# Patient Record
Sex: Female | Born: 1965 | Race: White | Hispanic: No | Marital: Married | Smoking: Never smoker
Health system: Southern US, Community
[De-identification: ages and names within clinical notes are randomized; demographics above are authoritative.]

## PROBLEM LIST (undated history)

## (undated) HISTORY — PX: HYSTEROSCOPY: SHX211

---

## 2017-12-11 ENCOUNTER — Encounter: Payer: Self-pay | Admitting: Obstetrics and Gynecology

## 2017-12-11 ENCOUNTER — Ambulatory Visit (INDEPENDENT_AMBULATORY_CARE_PROVIDER_SITE_OTHER): Payer: Commercial Managed Care - PPO | Admitting: Obstetrics and Gynecology

## 2017-12-11 VITALS — BP 137/78 | HR 88 | Ht 62.0 in | Wt 165.9 lb

## 2017-12-11 DIAGNOSIS — Z1321 Encounter for screening for nutritional disorder: Secondary | ICD-10-CM | POA: Diagnosis not present

## 2017-12-11 DIAGNOSIS — J0110 Acute frontal sinusitis, unspecified: Secondary | ICD-10-CM

## 2017-12-11 DIAGNOSIS — Z1231 Encounter for screening mammogram for malignant neoplasm of breast: Secondary | ICD-10-CM

## 2017-12-11 DIAGNOSIS — E669 Obesity, unspecified: Secondary | ICD-10-CM

## 2017-12-11 DIAGNOSIS — Z1239 Encounter for other screening for malignant neoplasm of breast: Secondary | ICD-10-CM

## 2017-12-11 DIAGNOSIS — E8940 Asymptomatic postprocedural ovarian failure: Secondary | ICD-10-CM | POA: Diagnosis not present

## 2017-12-11 DIAGNOSIS — Z7689 Persons encountering health services in other specified circumstances: Secondary | ICD-10-CM

## 2017-12-11 DIAGNOSIS — Z79890 Hormone replacement therapy (postmenopausal): Secondary | ICD-10-CM

## 2017-12-11 DIAGNOSIS — Z13220 Encounter for screening for lipoid disorders: Secondary | ICD-10-CM

## 2017-12-11 DIAGNOSIS — Z01419 Encounter for gynecological examination (general) (routine) without abnormal findings: Secondary | ICD-10-CM | POA: Diagnosis not present

## 2017-12-11 MED ORDER — ESTROGENS CONJUGATED 1.25 MG PO TABS: 1.2500 mg | ORAL_TABLET | Freq: Every day | ORAL | 3 refills | Status: DC

## 2017-12-11 MED ORDER — AZITHROMYCIN 250 MG PO TABS: ORAL_TABLET | ORAL | 1 refills | Status: DC

## 2017-12-11 MED ORDER — ESTROGENS CONJUGATED 1.25 MG PO TABS: 1.2500 mg | ORAL_TABLET | Freq: Every day | ORAL | 11 refills | Status: DC

## 2017-12-11 NOTE — Progress Notes (Signed)
Pt is not feeling well she think she may have a sinus infection. Pt is doing well with GYN.

## 2017-12-11 NOTE — Patient Instructions (Signed)
Health Maintenance, Female Adopting a healthy lifestyle and getting preventive care can go a long way to promote health and wellness. Talk with your health care provider about what schedule of regular examinations is right for you. This is a good chance for you to check in with your provider about disease prevention and staying healthy. In between checkups, there are plenty of things you can do on your own. Experts have done a lot of research about which lifestyle changes and preventive measures are most likely to keep you healthy. Ask your health care provider for more information. Weight and diet Eat a healthy diet  Be sure to include plenty of vegetables, fruits, low-fat dairy products, and lean protein.  Do not eat a lot of foods high in solid fats, added sugars, or salt.  Get regular exercise. This is one of the most important things you can do for your health. ? Most adults should exercise for at least 150 minutes each week. The exercise should increase your heart rate and make you sweat (moderate-intensity exercise). ? Most adults should also do strengthening exercises at least twice a week. This is in addition to the moderate-intensity exercise.  Maintain a healthy weight  Body mass index (BMI) is a measurement that can be used to identify possible weight problems. It estimates body fat based on height and weight. Your health care provider can help determine your BMI and help you achieve or maintain a healthy weight.  For females 20 years of age and older: ? A BMI below 18.5 is considered underweight. ? A BMI of 18.5 to 24.9 is normal. ? A BMI of 25 to 29.9 is considered overweight. ? A BMI of 30 and above is considered obese.  Watch levels of cholesterol and blood lipids  You should start having your blood tested for lipids and cholesterol at 50 years of age, then have this test every 5 years.  You may need to have your cholesterol levels checked more often if: ? Your lipid or  cholesterol levels are high. ? You are older than 50 years of age. ? You are at high risk for heart disease.  Cancer screening Lung Cancer  Lung cancer screening is recommended for adults 55-80 years old who are at high risk for lung cancer because of a history of smoking.  A yearly low-dose CT scan of the lungs is recommended for people who: ? Currently smoke. ? Have quit within the past 15 years. ? Have at least a 30-pack-year history of smoking. A pack year is smoking an average of one pack of cigarettes a day for 1 year.  Yearly screening should continue until it has been 15 years since you quit.  Yearly screening should stop if you develop a health problem that would prevent you from having lung cancer treatment.  Breast Cancer  Practice breast self-awareness. This means understanding how your breasts normally appear and feel.  It also means doing regular breast self-exams. Let your health care provider know about any changes, no matter how small.  If you are in your 20s or 30s, you should have a clinical breast exam (CBE) by a health care provider every 1-3 years as part of a regular health exam.  If you are 40 or older, have a CBE every year. Also consider having a breast X-ray (mammogram) every year.  If you have a family history of breast cancer, talk to your health care provider about genetic screening.  If you are at high risk   for breast cancer, talk to your health care provider about having an MRI and a mammogram every year.  Breast cancer gene (BRCA) assessment is recommended for women who have family members with BRCA-related cancers. BRCA-related cancers include: ? Breast. ? Ovarian. ? Tubal. ? Peritoneal cancers.  Results of the assessment will determine the need for genetic counseling and BRCA1 and BRCA2 testing.  Cervical Cancer Your health care provider may recommend that you be screened regularly for cancer of the pelvic organs (ovaries, uterus, and  vagina). This screening involves a pelvic examination, including checking for microscopic changes to the surface of your cervix (Pap test). You may be encouraged to have this screening done every 3 years, beginning at age 22.  For women ages 56-65, health care providers may recommend pelvic exams and Pap testing every 3 years, or they may recommend the Pap and pelvic exam, combined with testing for human papilloma virus (HPV), every 5 years. Some types of HPV increase your risk of cervical cancer. Testing for HPV may also be done on women of any age with unclear Pap test results.  Other health care providers may not recommend any screening for nonpregnant women who are considered low risk for pelvic cancer and who do not have symptoms. Ask your health care provider if a screening pelvic exam is right for you.  If you have had past treatment for cervical cancer or a condition that could lead to cancer, you need Pap tests and screening for cancer for at least 20 years after your treatment. If Pap tests have been discontinued, your risk factors (such as having a new sexual partner) need to be reassessed to determine if screening should resume. Some women have medical problems that increase the chance of getting cervical cancer. In these cases, your health care provider may recommend more frequent screening and Pap tests.  Colorectal Cancer  This type of cancer can be detected and often prevented.  Routine colorectal cancer screening usually begins at 50 years of age and continues through 50 years of age.  Your health care provider may recommend screening at an earlier age if you have risk factors for colon cancer.  Your health care provider may also recommend using home test kits to check for hidden blood in the stool.  A small camera at the end of a tube can be used to examine your colon directly (sigmoidoscopy or colonoscopy). This is done to check for the earliest forms of colorectal  cancer.  Routine screening usually begins at age 33.  Direct examination of the colon should be repeated every 5-10 years through 50 years of age. However, you may need to be screened more often if early forms of precancerous polyps or small growths are found.  Skin Cancer  Check your skin from head to toe regularly.  Tell your health care provider about any new moles or changes in moles, especially if there is a change in a mole's shape or color.  Also tell your health care provider if you have a mole that is larger than the size of a pencil eraser.  Always use sunscreen. Apply sunscreen liberally and repeatedly throughout the day.  Protect yourself by wearing long sleeves, pants, a wide-brimmed hat, and sunglasses whenever you are outside.  Heart disease, diabetes, and high blood pressure  High blood pressure causes heart disease and increases the risk of stroke. High blood pressure is more likely to develop in: ? People who have blood pressure in the high end of  the normal range (130-139/85-89 mm Hg). ? People who are overweight or obese. ? People who are African American.  If you are 21-29 years of age, have your blood pressure checked every 3-5 years. If you are 3 years of age or older, have your blood pressure checked every year. You should have your blood pressure measured twice-once when you are at a hospital or clinic, and once when you are not at a hospital or clinic. Record the average of the two measurements. To check your blood pressure when you are not at a hospital or clinic, you can use: ? An automated blood pressure machine at a pharmacy. ? A home blood pressure monitor.  If you are between 17 years and 37 years old, ask your health care provider if you should take aspirin to prevent strokes.  Have regular diabetes screenings. This involves taking a blood sample to check your fasting blood sugar level. ? If you are at a normal weight and have a low risk for diabetes,  have this test once every three years after 50 years of age. ? If you are overweight and have a high risk for diabetes, consider being tested at a younger age or more often. Preventing infection Hepatitis B  If you have a higher risk for hepatitis B, you should be screened for this virus. You are considered at high risk for hepatitis B if: ? You were born in a country where hepatitis B is common. Ask your health care provider which countries are considered high risk. ? Your parents were born in a high-risk country, and you have not been immunized against hepatitis B (hepatitis B vaccine). ? You have HIV or AIDS. ? You use needles to inject street drugs. ? You live with someone who has hepatitis B. ? You have had sex with someone who has hepatitis B. ? You get hemodialysis treatment. ? You take certain medicines for conditions, including cancer, organ transplantation, and autoimmune conditions.  Hepatitis C  Blood testing is recommended for: ? Everyone born from [birth year] through 1963. ? Anyone with known risk factors for hepatitis C.  Sexually transmitted infections (STIs)  You should be screened for sexually transmitted infections (STIs) including gonorrhea and chlamydia if: ? You are sexually active and are younger than 50 years of age. ? You are older than 50 years of age and your health care provider tells you that you are at risk for this type of infection. ? Your sexual activity has changed since you were last screened and you are at an increased risk for chlamydia or gonorrhea. Ask your health care provider if you are at risk.  If you do not have HIV, but are at risk, it may be recommended that you take a prescription medicine daily to prevent HIV infection. This is called pre-exposure prophylaxis (PrEP). You are considered at risk if: ? You are sexually active and do not regularly use condoms or know the HIV status of your partner(s). ? You take drugs by injection. ? You are  sexually active with a partner who has HIV.  Talk with your health care provider about whether you are at high risk of being infected with HIV. If you choose to begin PrEP, you should first be tested for HIV. You should then be tested every 3 months for as long as you are taking PrEP. Pregnancy  If you are premenopausal and you may become pregnant, ask your health care provider about preconception counseling.  If you may become  pregnant, take 400 to 800 micrograms (mcg) of folic acid every day.  If you want to prevent pregnancy, talk to your health care provider about birth control (contraception). Osteoporosis and menopause  Osteoporosis is a disease in which the bones lose minerals and strength with aging. This can result in serious bone fractures. Your risk for osteoporosis can be identified using a bone density scan.  If you are 89 years of age or older, or if you are at risk for osteoporosis and fractures, ask your health care provider if you should be screened.  Ask your health care provider whether you should take a calcium or vitamin D supplement to lower your risk for osteoporosis.  Menopause may have certain physical symptoms and risks.  Hormone replacement therapy may reduce some of these symptoms and risks. Talk to your health care provider about whether hormone replacement therapy is right for you. Follow these instructions at home:  Schedule regular health, dental, and eye exams.  Stay current with your immunizations.  Do not use any tobacco products including cigarettes, chewing tobacco, or electronic cigarettes.  If you are pregnant, do not drink alcohol.  If you are breastfeeding, limit how much and how often you drink alcohol.  Limit alcohol intake to no more than 1 drink per day for nonpregnant women. One drink equals 12 ounces of beer, 5 ounces of wine, or 1 ounces of hard liquor.  Do not use street drugs.  Do not share needles.  Ask your health care  provider for help if you need support or information about quitting drugs.  Tell your health care provider if you often feel depressed.  Tell your health care provider if you have ever been abused or do not feel safe at home. This information is not intended to replace advice given to you by your health care provider. Make sure you discuss any questions you have with your health care provider. Document Released: 04/30/2011 Document Revised: 03/22/2016 Document Reviewed: 07/19/2015 Elsevier Interactive Patient Education  2018 [place].  Menopause and Hormone Replacement Therapy What is hormone replacement therapy? Hormone replacement therapy (HRT) is the use of artificial (synthetic) hormones to replace hormones that your body stops producing during menopause. Menopause is the normal time of life when menstrual periods stop completely and the ovaries stop producing the female hormones estrogen and progesterone. This lack of hormones can affect your health and cause undesirable symptoms. HRT can relieve some of those symptoms. What are my options for HRT? HRT may consist of the synthetic hormones estrogen and progestin, or it may consist of only estrogen (estrogen-only therapy). You and your health care provider will decide which form of HRT is best for you. If you choose to be on HRT and you have a uterus, estrogen and progestin are usually prescribed. Estrogen-only therapy is used for women who do not have a uterus. Possible options for taking HRT include:  Pills.  Patches.  Gels.  Sprays.  Vaginal cream.  Vaginal rings.  Vaginal inserts.  The amount of hormone(s) that you take and how long you take the hormone(s) varies depending on your individual health. It is important to:  Begin HRT with the lowest possible dosage.  Stop HRT as soon as your health care provider tells you to stop.  Work with your health care provider so that you feel informed and comfortable with your  decisions.  What are the benefits of HRT? HRT can reduce the frequency and severity of menopausal symptoms. Benefits of HRT  vary depending on the menopausal symptoms that you have, the severity of your symptoms, and your overall health. HRT may help to improve the following menopausal symptoms:  Hot flashes and night sweats. These are sudden feelings of heat that spread over the face and body. The skin may turn red, like a blush. Night sweats are hot flashes that happen while you are sleeping or trying to sleep.  Bone loss (osteoporosis). The body loses calcium more quickly after menopause, causing the bones to become weaker. This can increase the risk for bone breaks (fractures).  Vaginal dryness. The lining of the vagina can become thin and dry, which can cause pain during sexual intercourse or cause infection, burning, or itching.  Urinary tract infections.  Urinary incontinence. This is a decreased ability to control when you urinate.  Irritability.  Short-term memory problems.  What are the risks of HRT? Risks of HRT vary depending on your individual health and medical history. Risks of HRT also depend on whether you receive both estrogen and progestin or you receive estrogen only.HRT may increase the risk of:  Spotting. This is when a small amount of bloodleaks from the vagina unexpectedly.  Endometrial cancer. This cancer is in the lining of the uterus (endometrium).  Breast cancer.  Increased density of breast tissue. This can make it harder to find breast cancer on a breast X-ray (mammogram).  Stroke.  Heart attack.  Blood clots.  Gallbladder disease.  Risks of HRT can increase if you have any of the following conditions:  Endometrial cancer.  Liver disease.  Heart disease.  Breast cancer.  History of blood clots.  History of stroke.  How should I care for myself while I am on HRT?  Take over-the-counter and prescription medicines only as told by your  health care provider.  Get mammograms, pelvic exams, and medical checkups as often as told by your health care provider.  Have Pap tests done as often as told by your health care provider. A Pap test is sometimes called a Pap smear. It is a screening test that is used to check for signs of cancer of the cervix and vagina. A Pap test can also identify the presence of infection or precancerous changes. Pap tests may be done: ? Every 3 years, starting at age 20. ? Every 5 years, starting after age 72, in combination with testing for human papillomavirus (HPV). ? More often or less often depending on other medical conditions you have, your age, and other risk factors.  It is your responsibility to get your Pap test results. Ask your health care provider or the department performing the test when your results will be ready.  Keep all follow-up visits as told by your health care provider. This is important. When should I seek medical care? Talk with your health care provider if:  You have any of these: ? Pain or swelling in your legs. ? Shortness of breath. ? Chest pain. ? Lumps or changes in your breasts or armpits. ? Slurred speech. ? Pain, burning, or bleeding when you urine.  You develop any of these: ? Unusual vaginal bleeding. ? Dizziness or headaches. ? Weakness or numbness in any part of your arms or legs. ? Pain in your abdomen.  This information is not intended to replace advice given to you by your health care provider. Make sure you discuss any questions you have with your health care provider. Document Released: 07/14/2003 Document Revised: 09/11/2016 Document Reviewed: 04/18/2015 Elsevier Interactive Patient Education  2017 [place].

## 2017-12-12 LAB — TSH: TSH: 1.65 u[IU]/mL (ref 0.450–4.500)

## 2017-12-12 LAB — COMPREHENSIVE METABOLIC PANEL
ALBUMIN: 4.4 g/dL (ref 3.5–5.5)
ALK PHOS: 66 IU/L (ref 39–117)
ALT: 20 IU/L (ref 0–32)
AST: 19 IU/L (ref 0–40)
Albumin/Globulin Ratio: 1.4 (ref 1.2–2.2)
BUN / CREAT RATIO: 24 — AB (ref 9–23)
BUN: 19 mg/dL (ref 6–24)
CHLORIDE: 100 mmol/L (ref 96–106)
CO2: 23 mmol/L (ref 20–29)
CREATININE: 0.78 mg/dL (ref 0.57–1.00)
Calcium: 9.5 mg/dL (ref 8.7–10.2)
GFR calc Af Amer: 103 mL/min/{1.73_m2} (ref >59)
GFR calc non Af Amer: 90 mL/min/{1.73_m2} (ref >59)
GLOBULIN, TOTAL: 3.2 g/dL (ref 1.5–4.5)
Glucose: 90 mg/dL (ref 65–99)
Potassium: 5 mmol/L (ref 3.5–5.2)
SODIUM: 137 mmol/L (ref 134–144)
Total Protein: 7.6 g/dL (ref 6.0–8.5)

## 2017-12-12 LAB — CBC
HEMATOCRIT: 41.1 % (ref 34.0–46.6)
HEMOGLOBIN: 13.5 g/dL (ref 11.1–15.9)
MCH: 30.4 pg (ref 26.6–33.0)
MCHC: 32.8 g/dL (ref 31.5–35.7)
MCV: 93 fL (ref 79–97)
Platelets: 293 10*3/uL (ref 150–379)
RBC: 4.44 x10E6/uL (ref 3.77–5.28)
RDW: 12.6 % (ref 12.3–15.4)
WBC: 5.7 10*3/uL (ref 3.4–10.8)

## 2017-12-12 LAB — VITAMIN D 25 HYDROXY (VIT D DEFICIENCY, FRACTURES): Vit D, 25-Hydroxy: 12.5 ng/mL — ABNORMAL LOW (ref 30.0–100.0)

## 2017-12-12 LAB — LIPID PANEL
CHOL/HDL RATIO: 3.3 ratio (ref 0.0–4.4)
Cholesterol, Total: 271 mg/dL — ABNORMAL HIGH (ref 100–199)
HDL: 83 mg/dL (ref >39)
LDL Calculated: 157 mg/dL — ABNORMAL HIGH (ref 0–99)
Triglycerides: 157 mg/dL — ABNORMAL HIGH (ref 0–149)
VLDL CHOLESTEROL CAL: 31 mg/dL (ref 5–40)

## 2017-12-13 ENCOUNTER — Other Ambulatory Visit: Payer: Self-pay

## 2017-12-13 NOTE — Progress Notes (Signed)
GYNECOLOGY ANNUAL PHYSICAL EXAM PROGRESS NOTE  Subjective:    Brenda Dean is a 52 y.o. G61P1001 female who presents to establish care and for an annual exam. She has relocated from the [facility] area of [place]. The patient has no complaints today. The patient is sexually active.  The patient is on hormonal replacement therapy, and has been on it for ~ 16 years since her total hysterectomy (performed for severe endometriosis). The patient wears seatbelts: yes. The patient participates in regular exercise: no. Has the patient ever been transfused or tattooed?: no. The patient reports that there is not] domestic violence in her life.    Gynecologic History Menarche age: 21 No LMP recorded. Patient has had a hysterectomy. Contraception: status post hysterectomy History of STI's: Denies Last Pap: ~ 16 years ago. Results were: normal.  Denies h/o abnormal pap smears. Last mammogram: 3-4 years ago. Results were: normal   Obstetric History   G1   P1   T1   P0   A0   L0    SAB0   TAB0   Ectopic0   Multiple0   Live Births0     # Outcome Date GA Lbr Len/2nd Weight Sex Delivery Anes PTL Lv  1 Term 56    M Vag-Spont         History reviewed. No pertinent past medical history.    Past Surgical History:  Procedure Laterality Date  . HYSTEROSCOPY      Family History  Problem Relation Age of Onset  . Hypertension Mother   . Diabetes Father   . Hypertension Father     Social History   Socioeconomic History  . Marital status: Married    Spouse name: Not on file  . Number of children: Not on file  . Years of education: Not on file  . Highest education level: Not on file  Social Needs  . Financial resource strain: Not on file  . Food insecurity - worry: Not on file  . Food insecurity - inability: Not on file  . Transportation needs - medical: Not on file  . Transportation needs - non-medical: Not on file  Occupational History  . Not on file  Tobacco Use  . Smoking status:  Never Smoker  . Smokeless tobacco: Never Used  Substance and Sexual Activity  . Alcohol use: No    Frequency: Never  . Drug use: No  . Sexual activity: Yes    Birth control/protection: Surgical  Other Topics Concern  . Not on file  Social History Narrative  . Not on file    Outpatient Encounter Medications as of 12/11/2017  Medication Sig  . estrogens, conjugated, (PREMARIN) 1.25 MG tablet Take 1 tablet (1.25 mg total) by mouth daily.  . fluticasone (FLONASE) 50 MCG/ACT nasal spray Place 1 spray into both nostrils daily.   No facility-administered encounter medications on file as of 12/11/2017.      Allergies  Allergen Reactions  . Penicillins Rash     Review of Systems Constitutional: negative for chills, fatigue, fevers and sweats Eyes: negative for irritation, redness and visual disturbance Ears, nose, mouth, throat, and face: negative for hearing loss, nasal congestion and pressure (x 2 weeks, not responding to OTC meds), snoring and tinnitus Respiratory: negative for asthma, cough, sputum Cardiovascular: negative for chest pain, dyspnea, exertional chest pressure/discomfort, irregular heart beat, palpitations and syncope Gastrointestinal: negative for abdominal pain, change in bowel habits, nausea and vomiting Genitourinary: negative for abnormal menstrual periods, genital lesions,  sexual problems and vaginal discharge, dysuria and urinary incontinence Integument/breast: negative for breast lump, breast tenderness and nipple discharge Hematologic/lymphatic: negative for bleeding and easy bruising Musculoskeletal:negative for back pain and muscle weakness Neurological: negative for dizziness, headaches, vertigo and weakness Endocrine: negative for diabetic symptoms including polydipsia, polyuria and skin dryness Allergic/Immunologic: negative for hay fever and urticaria       Objective:  Blood pressure 137/78, pulse 88, height 5\' 2"  (1.575 m), weight 165 lb 14.4 oz  (75.3 kg). Body mass index is 30.34 kg/m.  General Appearance:    Alert, cooperative, no distress, appears stated age, mild obesity  Head:    Normocephalic, without obvious abnormality, atraumatic  Eyes:    PERRL, conjunctiva/corneas clear, EOM's intact, both eyes  Ears:    Normal external ear canals, both ears  Nose:   Nares normal, septum midline, mucosa normal, no drainage. + mild sinus tenderness  Throat:   Lips, mucosa, and tongue normal; teeth and gums normal  Neck:   Supple, symmetrical, trachea midline, no adenopathy; thyroid: no enlargement/tenderness/nodules; no carotid bruit or JVD  Back:     Symmetric, no curvature, ROM normal, no CVA tenderness  Lungs:     Clear to auscultation bilaterally, respirations unlabored  Chest Wall:    No tenderness or deformity   Heart:    Regular rate and rhythm, S1 and S2 normal, no murmur, rub or gallop  Breast Exam:    No tenderness, masses, or nipple abnormality  Abdomen:     Soft, non-tender, bowel sounds active all four quadrants, no masses, no organomegaly.    Genitalia:    Pelvic:external genitalia normal, vagina without lesions, discharge, or tenderness, rectovaginal septum  normal. Cervix normal in appearance, no cervical motion tenderness, no adnexal masses or tenderness.  Uterus normal size, shape, mobile, regular contours, nontender.  Rectal:    Normal external sphincter.  No hemorrhoids appreciated. Internal exam not done.   Extremities:   Extremities normal, atraumatic, no cyanosis or edema  Pulses:   2+ and symmetric all extremities  Skin:   Skin color, texture, turgor normal, no rashes or lesions  Lymph nodes:   Cervical, supraclavicular, and axillary nodes normal  Neurologic:   CNII-XII intact, normal strength, sensation and reflexes throughout   .  Labs:   No labs   Assessment:    Healthy female exam.    Establish care   Menopausal HRT Sinus infection  Plan:     Blood tests: CBC with diff, Comprehensive metabolic  panel, Lipoproteins, TSH and Vitamin D. Breast self exam technique reviewed and patient encouraged to perform self-exam monthly. Contraception: post hysterectomy status. Discussed healthy lifestyle modifications. Mammogram. Pap smear.   Will be due for colon cancer screening within 1 year.  Menopause on HRT - patient has been on HRT for some time.  Desires refill of her hormones, specifically her estrogen. Discussed weaning and importance of using smallest dose for shortest time frame.  Patient notes she has tried weaning in the past which was unsuccessful. Will give refill, advised patient to try weaning again.   Discussed other conservative management options including saline spray, Sudafed, Mucinex, etc. If this does not work, will prescribe a Z-pack.   Follow up in 1 year for annual exam.    Hildred Laserherry, Irie Dowson, MD [facility]

## 2017-12-16 ENCOUNTER — Other Ambulatory Visit: Payer: Self-pay

## 2017-12-16 MED ORDER — VITAMIN D (ERGOCALCIFEROL) 1.25 MG (50000 UNIT) PO CAPS: 50000.0000 [IU] | ORAL_CAPSULE | ORAL | 0 refills | Status: DC

## 2017-12-16 NOTE — Telephone Encounter (Signed)
-----   Message from Hildred LaserAnika Cherry, MD sent at 12/13/2017  9:06 AM EST ----- Please inform patient that her cholesterol panel was mildly elevated. I would encourage her to endorse a low-fat, low-cholesterol diet, and encourage exercise 3-5 days a week for at least 30 minutes. We will check this again next year.  Her vitamin D levels were also very low. She will need to begin taking a supplement, Vitamin D 50,000 IU weekly x 12 weeks (needs to be prescribed), followed by a repeat lab in 3 months.

## 2017-12-16 NOTE — Telephone Encounter (Signed)
Pt called and informed that her Vit d 50000 was sent to the pharmacy.

## 2018-01-08 ENCOUNTER — Ambulatory Visit (INDEPENDENT_AMBULATORY_CARE_PROVIDER_SITE_OTHER): Payer: Commercial Managed Care - PPO | Admitting: Obstetrics and Gynecology

## 2018-01-08 ENCOUNTER — Encounter: Payer: Self-pay | Admitting: Obstetrics and Gynecology

## 2018-01-08 VITALS — BP 120/71 | HR 78 | Ht 62.0 in | Wt 173.8 lb

## 2018-01-08 DIAGNOSIS — E669 Obesity, unspecified: Secondary | ICD-10-CM

## 2018-01-08 MED ORDER — CYANOCOBALAMIN 1000 MCG/ML IJ SOLN: 1000.0000 ug | INTRAMUSCULAR | 1 refills | Status: DC

## 2018-01-08 NOTE — Progress Notes (Signed)
Subjective:  Arline AspCindy Fair is a 52 y.o. G1P1000 at Unknown being seen today for weight loss management- initial visit.  Patient reports General ROS: negative and reports previous weight loss attempts: started Keto diet in October 2018. Also used phentermine in past and did well.  Onset was gradual over year(s) .   Associated symptoms include: fatigue, change in clothing fit.   The patient has a surgical history ZO:[id number]:hysterectomy.   Past treatment has included: small frequent feedings,  appetite stimulant, exercise management.  The following portions of the patient's history were reviewed and updated as appropriate: allergies, current medications, past family history, past medical history, past social history, past surgical history and problem list.   Objective:   Vitals:   01/08/18 0938  BP: 120/71  Pulse: 78  Weight: 173 lb 12.8 oz (78.8 kg)  Height: 5\' 2"  (1.575 m)    General:  Alert, oriented and cooperative. Patient is in no acute distress.  :   :   :   :   :   :   PE: Well groomed female in no current distress,   Mental Status: Normal mood and affect. Normal behavior. Normal judgment and thought content.   Current BMI: Body mass index is 31.79 kg/m.   Assessment and Plan:  Obesity  There are no diagnoses linked to this encounter.  Plan: low carb, High protein diet RX for adipex 37.5 mg daily and B12 1000mcg.ml monthly, to start now with first injection given at today's visit. Reviewed side-effects common to both medications and expected outcomes. Increase daily water intake to at least 8 bottle a day, every day.  Goal is to reduse weight by 10% by end of three months, and will re-evaluate then.  RTC in 4 weeks for Nurse visit to check weight & BP, and get next B12 injections.    Please refer to After Visit Summary for other counseling recommendations.    VinelandShambley, Melody N, CNM   Melody [place] LakengrenN Shambley, CNM      Consider the Low Glycemic Index Diet and 6  smaller meals daily .  This boosts your metabolism and regulates your sugars:   Use the protein bar by Atkins because they have lots of fiber in them  Find the low carb flatbreads, tortillas and pita breads for sandwiches:  Joseph's makes a pita bread and a flat bread , available at [facility] and [employer]; Toufayah makes a low carb flatbread available at [employer] and HT that is 9 net carbs and 100 cal Mission makes a low carb whole wheat tortilla available at [employer],and most grocery stores with 6 net carbs and 210 cal  [place] yogurt can still have a lot of carbs .  Dannon Light N fit has 80 cal and 8 carbs

## 2018-01-08 NOTE — Patient Instructions (Signed)

## 2018-02-03 ENCOUNTER — Ambulatory Visit: Payer: Commercial Managed Care - PPO | Admitting: Obstetrics and Gynecology

## 2018-02-17 ENCOUNTER — Ambulatory Visit: Payer: Commercial Managed Care - PPO | Admitting: Obstetrics and Gynecology

## 2018-02-21 ENCOUNTER — Ambulatory Visit (INDEPENDENT_AMBULATORY_CARE_PROVIDER_SITE_OTHER): Payer: Commercial Managed Care - PPO | Admitting: Obstetrics and Gynecology

## 2018-02-21 ENCOUNTER — Encounter: Payer: Self-pay | Admitting: Obstetrics and Gynecology

## 2018-02-21 VITALS — BP 143/83 | HR 89 | Wt 163.7 lb

## 2018-02-21 DIAGNOSIS — E669 Obesity, unspecified: Secondary | ICD-10-CM | POA: Diagnosis not present

## 2018-02-21 MED ORDER — CYANOCOBALAMIN 1000 MCG/ML IJ SOLN
1000.0000 ug | Freq: Once | INTRAMUSCULAR | Status: AC
  Administered 2018-02-21: 1000 ug via INTRAMUSCULAR

## 2018-02-21 NOTE — Progress Notes (Signed)
Pt is here for wt, bp check, b-12 inj She is doing well, pt has lost a total of 10lbs, she is very happy  02/21/18 wt- 163.7lb 01/08/18 wt-173.5lb

## 2018-03-04 ENCOUNTER — Other Ambulatory Visit: Payer: Self-pay | Admitting: Obstetrics and Gynecology

## 2018-03-11 ENCOUNTER — Other Ambulatory Visit: Payer: Self-pay | Admitting: Obstetrics and Gynecology

## 2018-03-21 ENCOUNTER — Encounter: Payer: Commercial Managed Care - PPO | Admitting: Obstetrics and Gynecology

## 2018-04-01 ENCOUNTER — Other Ambulatory Visit: Payer: Self-pay | Admitting: Obstetrics and Gynecology

## 2018-06-20 ENCOUNTER — Encounter: Payer: Commercial Managed Care - PPO | Admitting: Obstetrics and Gynecology

## 2018-06-24 ENCOUNTER — Ambulatory Visit: Payer: 59 | Admitting: Obstetrics and Gynecology

## 2018-06-24 ENCOUNTER — Encounter: Payer: Self-pay | Admitting: Obstetrics and Gynecology

## 2018-06-24 VITALS — BP 141/84 | HR 85 | Ht 62.0 in | Wt 172.9 lb

## 2018-06-24 DIAGNOSIS — K5909 Other constipation: Secondary | ICD-10-CM | POA: Diagnosis not present

## 2018-06-24 DIAGNOSIS — Z13220 Encounter for screening for lipoid disorders: Secondary | ICD-10-CM

## 2018-06-24 DIAGNOSIS — E669 Obesity, unspecified: Secondary | ICD-10-CM

## 2018-06-24 DIAGNOSIS — E559 Vitamin D deficiency, unspecified: Secondary | ICD-10-CM | POA: Diagnosis not present

## 2018-06-24 DIAGNOSIS — Z6831 Body mass index (BMI) 31.0-31.9, adult: Secondary | ICD-10-CM | POA: Diagnosis not present

## 2018-06-24 MED ORDER — CYANOCOBALAMIN 1000 MCG/ML IJ SOLN
1000.0000 ug | INTRAMUSCULAR | 1 refills | Status: DC
Start: 2018-06-24 — End: 2018-10-13

## 2018-06-24 MED ORDER — POLYETHYLENE GLYCOL 3350 17 GM/SCOOP PO POWD: 1.0000 | Freq: Once | ORAL | 6 refills | Status: AC

## 2018-06-24 MED ORDER — PHENTERMINE HCL 37.5 MG PO TABS: 37.5000 mg | ORAL_TABLET | Freq: Every day | ORAL | 2 refills | Status: DC

## 2018-06-24 NOTE — Progress Notes (Signed)
Subjective:  Brenda Dean is a 52 y.o. G1P1000 at Unknown being seen today for weight loss management- initial visit.  Patient reports General ROS: negative and reports previous weight loss attempts: Management changes made at the last visit include: finished medications and just went back to old eating habits this summer. Desires restart of weight loss program. Also needs labs repeated for Vit D and lipid panels.   Reports longstanding history of constipation with only one BM a week and it is very large. It got worse with phentermine use.      Associated symptoms include: fatigue,abdominal pain/bloating, headaches, & change in clothing fit Previous/Current treatment includes: small frequent feedings, vitamin B-12 injections and appetite stimulant.  Pertinent medical history includes:  Vitamin d deficiency, constipation, elevated cholesterol.  Plans to use treadmill daily and adding weights. Will increase water intake again.   The following portions of the patient's history were reviewed and updated as appropriate: allergies, current medications, past family history, past medical history, past social history, past surgical history and problem list.   Objective:   Vitals:   06/24/18 1028  BP: (!) 141/84  Pulse: 85  Weight: 172 lb 14.4 oz (78.4 kg)  Height: 5\' 2"  (1.575 m)    General:  Alert, oriented and cooperative. Patient is in no acute distress.  :   :   :   :   :   :   PE: Well groomed female in no current distress,   Mental Status: Normal mood and affect. Normal behavior. Normal judgment and thought content.   Current BMI: Body mass index is 31.62 kg/m.   Assessment and Plan:  Obesity  1. Obesity (BMI 30-39.9)  - phentermine (ADIPEX-P) 37.5 MG tablet; Take 1 tablet (37.5 mg total) by mouth daily before breakfast.  Dispense: 30 tablet; Refill: 2   Plan: low carb, High protein diet RX for adipex 37.5 mg daily and B12 1000mcg.ml monthly, to start now with first  injection given at today's visit. Reviewed side-effects common to both medications and expected outcomes. Increase daily water intake to at least 8 bottle a day, every day.  Goal is to reduse weight by 10% by end of three months, and will re-evaluate then.  RTC in 4 weeks for Nurse visit to check weight & BP, and get next B12 injections.    Please refer to After Visit Summary for other counseling recommendations.    HancockShambley, Melody N, CNM   Melody [place]crestN Shambley, CNM       steps you can take to lower your prolactin levels include:  changing your diet and keeping your stress levels down stopping high-intensity workouts or activities that overwhelm you avoiding clothing that makes your chest uncomfortable avoiding activities and clothing that overstimulate your nipples taking vitamin B-6 and vitamin E supplements  2. Vit D deficiency Labs rechecked- information to review in AVS  3. chronic constipation To add Miralax as needed.- information on high fiber foods in AVS to review at home.

## 2018-06-24 NOTE — Patient Instructions (Addendum)
steps you can take to lower your prolactin levels include:     Vitamin D Deficiency Vitamin D deficiency is when your body does not have enough vitamin D. Vitamin D is important to your body for many reasons:  It helps the body to absorb two important minerals, called calcium and phosphorus.  It plays a role in bone health.  It may help to prevent some diseases, such as diabetes and multiple sclerosis.  It plays a role in muscle function, including heart function.  You can get vitamin D by:  Eating foods that naturally contain vitamin D.  Eating or drinking milk or other dairy products that have vitamin D added to them.  Taking a vitamin D supplement or a multivitamin supplement that contains vitamin D.  Being in the sun. Your body naturally makes vitamin D when your skin is exposed to sunlight. Your body changes the sunlight into a form of the vitamin that the body can use.  If vitamin D deficiency is severe, it can cause a condition in which your bones become soft. In adults, this condition is called osteomalacia. In children, this condition is called rickets. What are the causes? Vitamin D deficiency may be caused by:  Not eating enough foods that contain vitamin D.  Not getting enough sun exposure.  Having certain digestive system diseases that make it difficult for your body to absorb vitamin D. These diseases include Crohn disease, chronic pancreatitis, and cystic fibrosis.  Having a surgery in which a part of the stomach or a part of the small intestine is removed.  Being obese.  Having chronic kidney disease or liver disease.  What increases the risk? This condition is more likely to develop in:  Older people.  People who do not spend much time outdoors.  People who live in a long-term care facility.  People who have had broken bones.  People with weak or thin bones (osteoporosis).  People who have a disease or condition that changes how the body  absorbs vitamin D.  People who have dark skin.  People who take certain medicines, such as steroid medicines or certain seizure medicines.  People who are overweight or obese.  What are the signs or symptoms? In mild cases of vitamin D deficiency, there may not be any symptoms. If the condition is severe, symptoms may include:  Bone pain.  Muscle pain.  Falling often.  Broken bones caused by a Bebout injury.  How is this diagnosed? This condition is usually diagnosed with a blood test. How is this treated? Treatment for this condition may depend on what caused the condition. Treatment options include:  Taking vitamin D supplements.  Taking a calcium supplement. Your health care provider will suggest what dose is best for you.  Follow these instructions at home:  Take medicines and supplements only as told by your health care provider.  Eat foods that contain vitamin D. Choices include: ? Fortified dairy products, cereals, or juices. Fortified means that vitamin D has been added to the food. Check the label on the package to be sure. ? Fatty fish, such as salmon or trout. ? Eggs. ? Oysters.  Do not use a tanning bed.  Maintain a healthy weight. Lose weight, if needed.  Keep all follow-up visits as told by your health care provider. This is important. Contact a health care provider if:  Your symptoms do not go away.  You feel like throwing up (nausea) or you throw up (vomit).  You have  fewer bowel movements than usual or it is difficult for you to have a bowel movement (constipation). This information is not intended to replace advice given to you by your health care provider. Make sure you discuss any questions you have with your health care provider. Document Released: 01/07/2012 Document Revised: 03/28/2016 Document Reviewed: 03/02/2015 Elsevier Interactive Patient Education  2018 [employer].  Constipation, Adult Constipation is when a person has fewer bowel  movements in a week than normal, has difficulty having a bowel movement, or has stools that are dry, hard, or larger than normal. Constipation may be caused by an underlying condition. It may become worse with age if a person takes certain medicines and does not take in enough fluids. Follow these instructions at home: Eating and drinking   Eat foods that have a lot of fiber, such as fresh fruits and vegetables, whole grains, and beans.  Limit foods that are high in fat, low in fiber, or overly processed, such as french fries, hamburgers, cookies, candies, and soda.  Drink enough fluid to keep your urine clear or pale yellow. General instructions  Exercise regularly or as told by your health care provider.  Go to the restroom when you have the urge to go. Do not hold it in.  Take over-the-counter and prescription medicines only as told by your health care provider. These include any fiber supplements.  Practice pelvic floor retraining exercises, such as deep breathing while relaxing the lower abdomen and pelvic floor relaxation during bowel movements.  Watch your condition for any changes.  Keep all follow-up visits as told by your health care provider. This is important. Contact a health care provider if:  You have pain that gets worse.  You have a fever.  You do not have a bowel movement after 4 days.  You vomit.  You are not hungry.  You lose weight.  You are bleeding from the anus.  You have thin, pencil-like stools. Get help right away if:  You have a fever and your symptoms suddenly get worse.  You leak stool or have blood in your stool.  Your abdomen is bloated.  You have severe pain in your abdomen.  You feel dizzy or you faint. This information is not intended to replace advice given to you by your health care provider. Make sure you discuss any questions you have with your health care provider. Document Released: 07/13/2004 Document Revised: 05/04/2016  Document Reviewed: 04/04/2016 Elsevier Interactive Patient Education  2018 [employer].

## 2018-06-25 LAB — LIPID PANEL
Chol/HDL Ratio: 3.3 ratio (ref 0.0–4.4)
Cholesterol, Total: 210 mg/dL — ABNORMAL HIGH (ref 100–199)
HDL: 64 mg/dL (ref >39)
LDL CALC: 119 mg/dL — AB (ref 0–99)
Triglycerides: 137 mg/dL (ref 0–149)
VLDL CHOLESTEROL CAL: 27 mg/dL (ref 5–40)

## 2018-06-25 LAB — VITAMIN D 25 HYDROXY (VIT D DEFICIENCY, FRACTURES): Vit D, 25-Hydroxy: 27.3 ng/mL — ABNORMAL LOW (ref 30.0–100.0)

## 2018-07-21 ENCOUNTER — Ambulatory Visit: Payer: 59 | Admitting: Obstetrics and Gynecology

## 2018-10-13 ENCOUNTER — Ambulatory Visit: Payer: 59 | Admitting: Obstetrics and Gynecology

## 2018-10-13 ENCOUNTER — Encounter: Payer: Self-pay | Admitting: Obstetrics and Gynecology

## 2018-10-13 DIAGNOSIS — E669 Obesity, unspecified: Secondary | ICD-10-CM

## 2018-10-13 MED ORDER — PHENTERMINE HCL 37.5 MG PO TABS: 37.5000 mg | ORAL_TABLET | Freq: Every day | ORAL | 2 refills | Status: DC

## 2018-10-13 MED ORDER — CYANOCOBALAMIN 1000 MCG/ML IJ SOLN: 1000.0000 ug | INTRAMUSCULAR | 1 refills | Status: DC

## 2018-10-13 NOTE — Progress Notes (Signed)
Here for follow up weight visit and B12. Desires to continue with medication to get BMI <27. Is using treadmill and taking stairs.  Has cut out all sweet drinks.     10/13/18 161 lbs 06/24/18 172 lbs 14 oz   Waist 36 in  B12 injection given and will Return to clinic in 4-8 weeks for weight check.   Melody Shambley,CNM

## 2018-11-12 ENCOUNTER — Ambulatory Visit: Payer: 59 | Admitting: Obstetrics and Gynecology

## 2018-12-19 ENCOUNTER — Other Ambulatory Visit: Payer: Self-pay | Admitting: Obstetrics and Gynecology

## 2019-01-08 ENCOUNTER — Other Ambulatory Visit: Payer: Self-pay | Admitting: Obstetrics and Gynecology

## 2019-04-10 ENCOUNTER — Other Ambulatory Visit: Payer: Self-pay | Admitting: Obstetrics and Gynecology

## 2019-04-29 ENCOUNTER — Other Ambulatory Visit: Payer: Self-pay

## 2019-04-29 ENCOUNTER — Ambulatory Visit (INDEPENDENT_AMBULATORY_CARE_PROVIDER_SITE_OTHER): Payer: 59 | Admitting: Orthopedic Surgery

## 2019-04-29 ENCOUNTER — Ambulatory Visit: Payer: Self-pay

## 2019-04-29 ENCOUNTER — Encounter: Payer: Self-pay | Admitting: Orthopedic Surgery

## 2019-04-29 DIAGNOSIS — M25821 Other specified joint disorders, right elbow: Secondary | ICD-10-CM | POA: Diagnosis not present

## 2019-04-29 DIAGNOSIS — M25521 Pain in right elbow: Secondary | ICD-10-CM

## 2019-04-29 NOTE — Progress Notes (Signed)
Office Visit Note   Patient: Brenda Dean           Date of Birth: [birth year]           MRN: [id number] Visit Date: 04/29/2019 Requested by: No referring provider defined for this encounter. PCP: Patient, No Pcp Per  Subjective: Chief Complaint  Patient presents with  . Right Elbow - Pain    HPI: Brenda Dean is a patient with right elbow pain.  She denies any history of injury.  She is had pain for 6 months.  She is right-hand dominant.  Her pain is worse with any activity.  She also has pain at night on most nights.  The pain will wake her from sleep.  Ibuprofen helps minimally.  Pain radiates up into the shoulder also.  She has some occasional neck pain and occasional numbness and tingling.  The elbow pain is both on the medial and lateral side.  She does have pain with elbow extension.  She is using an elbow sleeve which does not help much.  She feels like she is losing strength in her hand.              ROS: All systems reviewed are negative as they relate to the chief complaint within the history of present illness.  Patient denies  fevers or chills.   Assessment & Plan: Visit Diagnoses:  1. Pain in right elbow   2. Mass of joint of right elbow     Plan: Impression is right elbow pain and mass which appears to be partially cystic by ultrasound examination.  This is a pretty atypical presentation for tennis elbow.  She needs MRI with IV contrast to evaluate cystic or partially cystic structure within the flexor pronator mass..  We do need to get to the bottom of this because it is not a typical presentation of tennis elbow and the night pain is somewhat worrisome.  She is working in an office type job.  Follow-Up Instructions: Return for after MRI.   Orders:  Orders Placed This Encounter  Procedures  . XR Elbow 2 Views Right  . MR Elbow Right w/ contrast  . Arthrogram   No orders of the defined types were placed in this encounter.     Procedures: No procedures performed    Clinical Data: No additional findings.  Objective: Vital Signs: There were no vitals taken for this visit.  Physical Exam:   Constitutional: Patient appears well-developed HEENT:  Head: Normocephalic Eyes:EOM are normal Neck: Normal range of motion Cardiovascular: Normal rate Pulmonary/chest: Effort normal Neurologic: Patient is alert Skin: Skin is warm Psychiatric: Patient has normal mood and affect    Ortho Exam: Ortho exam demonstrates about a 5 degree flexion contracture on the right elbow compared to the left.  She does have full flexion.  She has tenderness in the proximal flexor pronator mass with a fullness in this region.  This is tender to palpation.  Radial pulses intact.  No definite paresthesias on the dorsal or palmar aspect of the hand.  EPL FPL interosseous function is intact.  She has some pain with resisted supination not much pain with resisted pronation.  Only mild tenderness over that lateral epicondyle.  Ultrasound examination demonstrates cystic structure within the flexor pronator mass measuring about 4 x 4 cm.  Not much in the way of tendinosis at the lateral epicondyle.  Specialty Comments:  No specialty comments available.  Imaging: Xr Elbow 2 Views Right  Result Date: 04/29/2019  AP lateral right elbow reviewed.  No arthritis is present.  No soft tissue calcifications.  No fracture.  Normal right elbow    PMFS History: There are no active problems to display for this patient.  History reviewed. No pertinent past medical history.  Family History  Problem Relation Age of Onset  . Hypertension Mother   . Diabetes Father   . Hypertension Father     Past Surgical History:  Procedure Laterality Date  . HYSTEROSCOPY     Social History   Occupational History  . Not on file  Tobacco Use  . Smoking status: Never Smoker  . Smokeless tobacco: Never Used  Substance and Sexual Activity  . Alcohol use: No    Frequency: Never  . Drug use: No  .  Sexual activity: Yes    Birth control/protection: Surgical

## 2019-04-30 NOTE — Progress Notes (Signed)
I've canceled arthrogram and edited note in referral

## 2019-05-06 NOTE — Addendum Note (Signed)
Addended by: Daylene Posey T on: 05/06/2019 11:36 AM   Modules accepted: Orders

## 2019-05-12 ENCOUNTER — Other Ambulatory Visit: Payer: Self-pay | Admitting: Radiology

## 2019-05-12 MED ORDER — DIAZEPAM 5 MG PO TABS: ORAL_TABLET | ORAL | 0 refills | Status: DC

## 2019-06-05 ENCOUNTER — Other Ambulatory Visit: Payer: 59

## 2019-06-15 ENCOUNTER — Telehealth: Payer: Self-pay | Admitting: Orthopedic Surgery

## 2019-06-15 ENCOUNTER — Other Ambulatory Visit: Payer: Self-pay | Admitting: Surgical

## 2019-06-15 MED ORDER — DIAZEPAM 5 MG PO TABS: ORAL_TABLET | ORAL | 0 refills | Status: DC

## 2019-06-15 NOTE — Telephone Encounter (Signed)
Patient called advised she is having an MRI 06/18/2019 and need something to relax her before she have the MRI. The number to contact patient is [phone]

## 2019-06-15 NOTE — Telephone Encounter (Signed)
Can you please submit Valium 5mg  for her prior to MRI procedure? It is considered a controlled substance. Thanks.

## 2019-06-15 NOTE — Telephone Encounter (Signed)
[facility] [place] on [address]. Please see previous note.

## 2019-06-15 NOTE — Telephone Encounter (Signed)
Please send rx mentioned in previous message to this pharmacy.  Thanks.

## 2019-06-16 NOTE — Telephone Encounter (Signed)
I called [facility] [address] and asked that they cancel rx for Valium. [facility] [address] in [place] at [phone]

## 2019-06-16 NOTE — Telephone Encounter (Signed)
I called patient and went over instructions. She [facility] [address] in [place]. I will call in and change locations.

## 2019-06-18 ENCOUNTER — Other Ambulatory Visit: Payer: Self-pay

## 2019-06-18 ENCOUNTER — Ambulatory Visit
Admission: RE | Admit: 2019-06-18 | Discharge: 2019-06-18 | Disposition: A | Discharge status: Home / Self Care | Payer: 59 | Source: Ambulatory Visit | Attending: Orthopedic Surgery | Admitting: Orthopedic Surgery

## 2019-06-18 DIAGNOSIS — M25521 Pain in right elbow: Secondary | ICD-10-CM

## 2019-06-18 MED ORDER — GADOBENATE DIMEGLUMINE 529 MG/ML IV SOLN
15.0000 mL | Freq: Once | INTRAVENOUS | Status: AC | PRN
  Administered 2019-06-18: 15 mL via INTRAVENOUS

## 2019-06-22 ENCOUNTER — Ambulatory Visit: Payer: Self-pay

## 2019-06-22 ENCOUNTER — Encounter: Payer: Self-pay | Admitting: Orthopedic Surgery

## 2019-06-22 ENCOUNTER — Ambulatory Visit (INDEPENDENT_AMBULATORY_CARE_PROVIDER_SITE_OTHER): Payer: 59 | Admitting: Orthopedic Surgery

## 2019-06-22 DIAGNOSIS — M542 Cervicalgia: Secondary | ICD-10-CM

## 2019-06-22 DIAGNOSIS — M5410 Radiculopathy, site unspecified: Secondary | ICD-10-CM

## 2019-06-24 ENCOUNTER — Encounter: Payer: Self-pay | Admitting: Orthopedic Surgery

## 2019-06-24 NOTE — Progress Notes (Signed)
Office Visit Note   Patient: Brenda Dean           Date of Birth: 1966-02-08           MRN: [id number] Visit Date: 06/22/2019 Requested by: No referring provider defined for this encounter. PCP: Patient, No Pcp Per  Subjective: Chief Complaint  Patient presents with  . Follow-up    HPI: Brenda Dean is a patient with right elbow pain.  Since have seen her she is had an MRI scan.  She was really having more deep symptoms in the elbow waking her from sleep at night.  Occasionally radiating up into the trapezial region but not as much in the shoulder blade.  Is having some occasional tingling.  She states that her neck range of motion is okay.  She does have symptoms on a daily basis and she cannot sleep on the left-hand side.  The pain does run down the arm and it is a deep pain.  MRI scan is reviewed and it shows no discrete mass.  Shows a little bit of lateral epicondylitis and tendinosis but otherwise normal right elbow.  Specifically in the volar aspect of that arm no masses present.              ROS: All systems reviewed are negative as they relate to the chief complaint within the history of present illness.  Patient denies  fevers or chills.   Assessment & Plan: Visit Diagnoses:  1. Radiculopathy of arm     Plan: Impression is continued right shoulder symptoms and arm symptoms with no clear explanation on MRI scan.  2 view cervical spine normal.  I think this could be radiculopathy or some type of unusual nerve compression.  Plan is MRI cervical spine to evaluate right-sided radiculopathy this been going on now for at least 6 months with daily symptoms and no clear explanation of structural abnormality in the elbow.  Follow-Up Instructions: Return for after MRI.   Orders:  Orders Placed This Encounter  Procedures  . XR Cervical Spine 2 or 3 views  . MR Cervical Spine w/o contrast   No orders of the defined types were placed in this encounter.     Procedures: No procedures  performed   Clinical Data: No additional findings.  Objective: Vital Signs: There were no vitals taken for this visit.  Physical Exam:   Constitutional: Patient appears well-developed HEENT:  Head: Normocephalic Eyes:EOM are normal Neck: Normal range of motion Cardiovascular: Normal rate Pulmonary/chest: Effort normal Neurologic: Patient is alert Skin: Skin is warm Psychiatric: Patient has normal mood and affect    Ortho Exam: Ortho exam demonstrates full active and passive range of motion of the elbow.  No real discrete difference in girth of the forearm right versus left.  Negative Tinel's cubital tunnel in the elbow bilaterally.  Patient has good grip EPL FPL interosseous wrist extra extension bicep tricep deltoid strength as well as good pronation supination strength.  Only mild tenderness in the lateral epicondyle no tenderness medial epicondyle  Specialty Comments:  No specialty comments available.  Imaging: No results found.   PMFS History: There are no active problems to display for this patient.  History reviewed. No pertinent past medical history.  Family History  Problem Relation Age of Onset  . Hypertension Mother   . Diabetes Father   . Hypertension Father     Past Surgical History:  Procedure Laterality Date  . HYSTEROSCOPY     Social History  Occupational History  . Not on file  Tobacco Use  . Smoking status: Never Smoker  . Smokeless tobacco: Never Used  Substance and Sexual Activity  . Alcohol use: No    Frequency: Never  . Drug use: No  . Sexual activity: Yes    Birth control/protection: Surgical

## 2019-06-29 ENCOUNTER — Other Ambulatory Visit: Payer: 59

## 2019-07-16 ENCOUNTER — Other Ambulatory Visit: Payer: Self-pay | Admitting: Obstetrics and Gynecology

## 2019-07-24 ENCOUNTER — Telehealth: Payer: Self-pay | Admitting: Obstetrics and Gynecology

## 2019-07-24 ENCOUNTER — Other Ambulatory Visit: Payer: Self-pay | Admitting: *Deleted

## 2019-07-24 MED ORDER — ESTROGENS CONJUGATED 1.25 MG PO TABS: 1.2500 mg | ORAL_TABLET | Freq: Every day | ORAL | 0 refills | Status: DC

## 2019-07-24 NOTE — Telephone Encounter (Signed)
The patient called and stated that she needs to come in for medication management/ Refill. Pt originally scheduled apt via MyChart to see Melody. Pt wants to know if she is able to see one provider for both medications and is also wanting to know if she can her prescription PREMARIN 1.25 MG tablet [6509] filled before her next appointment bc the pt will run out of medication by that appointment date. Pt prefers to receive a call back from nurse or provider, due to her having additional questions and also wanting to know if this can be resolved. Please advise.

## 2019-07-29 NOTE — Telephone Encounter (Signed)
Pt called no answer LM informing pt that she could choose which provider she wanted to see [facility] or [facility] it was up to her. Pt was advised that she could let someone know at her next visit.

## 2019-08-19 ENCOUNTER — Encounter: Payer: 59 | Admitting: Obstetrics and Gynecology

## 2019-08-21 ENCOUNTER — Encounter: Payer: 59 | Admitting: Obstetrics and Gynecology

## 2019-09-03 ENCOUNTER — Ambulatory Visit: Payer: 59 | Admitting: Obstetrics and Gynecology

## 2019-09-03 ENCOUNTER — Other Ambulatory Visit: Payer: Self-pay

## 2019-09-03 ENCOUNTER — Encounter: Payer: Self-pay | Admitting: Obstetrics and Gynecology

## 2019-09-03 VITALS — BP 127/70 | HR 77 | Ht 62.0 in | Wt 175.6 lb

## 2019-09-03 DIAGNOSIS — E669 Obesity, unspecified: Secondary | ICD-10-CM

## 2019-09-03 DIAGNOSIS — Z7689 Persons encountering health services in other specified circumstances: Secondary | ICD-10-CM

## 2019-09-03 DIAGNOSIS — Z79890 Hormone replacement therapy (postmenopausal): Secondary | ICD-10-CM

## 2019-09-03 DIAGNOSIS — Z6832 Body mass index (BMI) 32.0-32.9, adult: Secondary | ICD-10-CM

## 2019-09-03 DIAGNOSIS — Z5181 Encounter for therapeutic drug level monitoring: Secondary | ICD-10-CM | POA: Diagnosis not present

## 2019-09-03 MED ORDER — PHENTERMINE HCL 37.5 MG PO TABS: 37.5000 mg | ORAL_TABLET | Freq: Every day | ORAL | 2 refills | Status: DC

## 2019-09-03 MED ORDER — ESTROGENS CONJUGATED 1.25 MG PO TABS: 1.2500 mg | ORAL_TABLET | Freq: Every day | ORAL | 4 refills | Status: DC

## 2019-09-03 MED ORDER — CYANOCOBALAMIN 1000 MCG/ML IJ SOLN
1000.0000 ug | Freq: Once | INTRAMUSCULAR | Status: AC
  Administered 2019-09-03: 15:00:00 1000 ug via INTRAMUSCULAR

## 2019-09-03 MED ORDER — CYANOCOBALAMIN 1000 MCG/ML IJ SOLN: 1000.0000 ug | INTRAMUSCULAR | 1 refills | Status: AC

## 2019-09-03 NOTE — Addendum Note (Signed)
Addended by: Raliegh Ip on: 09/03/2019 02:40 PM   Modules accepted: Orders

## 2019-09-03 NOTE — Patient Instructions (Signed)

## 2019-09-03 NOTE — Progress Notes (Signed)
Patient is here for a refill on premarin. Patient would like to restart phentermine. Waist circumference 39".'

## 2019-09-03 NOTE — Progress Notes (Signed)
Subjective:  Brenda Dean is a 53 y.o. G1P1000 at Unknown being seen today for weight loss management- initial visit.  Patient reports General ROS: negative and reports previous weight loss attempts: were successful with adipex in the past.   Onset was gradual  Over last few months after moving..    The patient has a surgical history of: hysterectomy.    Past treatment has included: small frequent feedings, nutritional supplement, vitamin supplement, , vitamin B-12 injections.  The following portions of the patient's history were reviewed and updated as appropriate: allergies, current medications, past family history, past medical history, past social history, past surgical history and problem list.   Objective:   Vitals:   09/03/19 1348  BP: 127/70  Pulse: 77  Weight: 175 lb 9 oz (79.6 kg)  Height: 5\' 2"  (1.575 m)    General:  Alert, oriented and cooperative. Patient is in no acute distress.  :   :   :   :   :   :   PE: Well groomed female in no current distress,   Mental Status: Normal mood and affect. Normal behavior. Normal judgment and thought content.   Current BMI: Body mass index is 32.11 kg/m.   Assessment and Plan:  Obesity  There are no diagnoses linked to this encounter.  Plan: low carb, High protein diet RX for adipex 37.5 mg daily and B12 1010mcg.ml monthly, to start now with first injection given at today's visit. Reviewed side-effects common to both medications and expected outcomes. Increase daily water intake to at least 8 bottle a day, every day.  Goal is to reduse weight by 10% by end of three months, and will re-evaluate then. Also refilled premarin, and will plan AE in 2 months.  RTC in 4 weeks for Nurse visit to check weight & BP, and get next B12 injections.    Please refer to After Visit Summary for other counseling recommendations.    Lisle, Alexsander Cavins N, CNM   [name], CNM      Consider the Low Glycemic Index Diet and  6 smaller meals daily .  This boosts your metabolism and regulates your sugars:   Use the protein bar by Atkins because they have lots of fiber in them  Find the low carb flatbreads, tortillas and pita breads for sandwiches:  Joseph's makes a pita bread and a flat bread , available at [facility] and [employer]; [place] makes a low carb flatbread available at [employer] and HT that is 9 net carbs and 100 cal [place] makes a low carb whole wheat tortilla available at [employer] most grocery stores with 6 net carbs and 210 cal  [place] yogurt can still have a lot of carbs .  Dannon Light N fit has 80 cal and 8 carbs

## 2019-10-01 ENCOUNTER — Ambulatory Visit: Payer: 59

## 2019-10-02 ENCOUNTER — Ambulatory Visit: Payer: 59

## 2019-11-18 ENCOUNTER — Encounter: Payer: 59 | Admitting: Certified Nurse Midwife

## 2019-11-27 ENCOUNTER — Encounter: Payer: 59 | Admitting: Obstetrics and Gynecology

## 2019-12-10 ENCOUNTER — Encounter: Payer: 59 | Admitting: Obstetrics and Gynecology

## 2019-12-11 ENCOUNTER — Encounter: Payer: 59 | Admitting: Obstetrics and Gynecology

## 2019-12-30 ENCOUNTER — Other Ambulatory Visit: Payer: Self-pay

## 2019-12-30 ENCOUNTER — Ambulatory Visit: Payer: 59 | Admitting: Obstetrics and Gynecology

## 2019-12-30 ENCOUNTER — Encounter: Payer: Self-pay | Admitting: Obstetrics and Gynecology

## 2019-12-30 VITALS — BP 145/82 | HR 121 | Ht 62.0 in | Wt 170.7 lb

## 2019-12-30 DIAGNOSIS — E785 Hyperlipidemia, unspecified: Secondary | ICD-10-CM

## 2019-12-30 DIAGNOSIS — Z7689 Persons encountering health services in other specified circumstances: Secondary | ICD-10-CM

## 2019-12-30 DIAGNOSIS — R030 Elevated blood-pressure reading, without diagnosis of hypertension: Secondary | ICD-10-CM

## 2019-12-30 DIAGNOSIS — E669 Obesity, unspecified: Secondary | ICD-10-CM

## 2019-12-30 DIAGNOSIS — Z8639 Personal history of other endocrine, nutritional and metabolic disease: Secondary | ICD-10-CM | POA: Diagnosis not present

## 2019-12-30 MED ORDER — PHENTERMINE HCL 37.5 MG PO TABS: 37.5000 mg | ORAL_TABLET | Freq: Every day | ORAL | 0 refills | Status: AC

## 2019-12-30 NOTE — Progress Notes (Signed)
GYNECOLOGY PROGRESS NOTE  Subjective:    Patient ID: Brenda Dean, female    DOB: [birth year], 54 y.o.   MRN: [id number]  HPI  Patient is a 54 y.o. menopausal female who presents for medication refill for weight loss management.  She has a PMH of mild obesity, and mild dyslipidemia. She was previously being seen by Lorelle Gibbs, CNM.  Patient reports that she began use of Phentermine in November along with Vitamin B12 injections. States that she stopped using the medication in January after getting through the holidays and took a break (had gotten down to 161 lbs from 175 lbs).  Notes that she is ready to resume.  Restarted her medication 2 weeks ago (had pills left from prior prescription) and requests refill today. She has used Phentermine in the past (most recent was 2019), noted good results.     Current interventions:  1. Diet - patient notes she is doing a mix of keto and Atkins diet.  Increasing protein, lowering carb intake.  Drinks approximately 72 oz of water most days. Has 1 cup of coffee per day. 2. Activity - has recently restarted working out, uses the treadmill for approximately 30 minutes 3 days per week.  3. Reports bowel movements are not regular, usually has a BM every 3 days. Has tried using metamucil.    The following portions of the patient's history were reviewed and updated as appropriate:  She  has a past medical history of Dyslipidemia (01/02/2020) and Obesity (BMI 30.0-34.9) (01/02/2020).   She  has a past surgical history that includes Hysteroscopy.   Her family history includes Diabetes in her father; Hypertension in her father and mother.   She  reports that she has never smoked. She has never used smokeless tobacco. She reports that she does not drink alcohol or use drugs.   Current Outpatient Medications on File Prior to Visit  Medication Sig Dispense Refill  . estrogens, conjugated, (PREMARIN) 1.25 MG tablet Take 1 tablet (1.25 mg total) by mouth daily. 90  tablet 4  . cyanocobalamin (,VITAMIN B-12,) 1000 MCG/ML injection Inject 1 mL (1,000 mcg total) into the muscle every 30 (thirty) days. (Patient not taking: Reported on 12/30/2019) 10 mL 1  . diazepam (VALIUM) 5 MG tablet Take 1 tablet 1 hour prior to MRI scan, repeat at arrival if needed. (Patient not taking: Reported on 09/03/2019) 3 tablet 0  . fluticasone (FLONASE) 50 MCG/ACT nasal spray Place 1 spray into both nostrils daily.    . Vitamin D, Ergocalciferol, (DRISDOL) 50000 units CAPS capsule Take 1 capsule (50,000 Units total) by mouth every 7 (seven) days. (Patient not taking: Reported on 09/03/2019) 12 capsule 0   No current facility-administered medications on file prior to visit.   She is allergic to penicillins..  Review of Systems Pertinent items noted in HPI and remainder of comprehensive ROS otherwise negative.   Objective:    Vitals with BMI 12/30/2019 09/03/2019 10/13/2018  Height 5\' 2"  5\' 2"  5\' 2"   Weight 170 lbs 11 oz 175 lbs 9 oz 161 lbs  BMI 31.21 97.0 26.37  Systolic 858 850 277  Diastolic 82 70 90  Pulse 412 77 101    General appearance: alert and no distress Abdomen: soft, non-tender.  Waist circumference 39.5 in.    Labs:  Lab Results  Component Value Date   CHOL 210 (H) 06/24/2018   HDL 64 06/24/2018   LDLCALC 119 (H) 06/24/2018   TRIG 137 06/24/2018   CHOLHDL 3.3  06/24/2018    Results for KINZLIE, HARNEY (MRN [id number]) as of 12/30/2019 20:39  Ref. Range 12/11/2017 11:18 06/24/2018 11:04  Vitamin D, 25-Hydroxy Latest Ref Range: 30.0 - 100.0 ng/mL 12.5 (L) 27.3 (L)   Assessment:   Weight management Obesity, Body mass index is 31.22 kg/m. Dyslipidemia  History of Vitamin D deficiency Elevated BP  Plan:   - Lengthy discussion had with patient regarding use of Phentermine to jumpstart weight loss, use of controlled substances for weight loss, rebound weight gain after use, and that use should be short term (3 months with re-evaluation for continuation).  Discussed patient's weight loss goals, should attempt to lose 10% of current weight by the end of the 3 months, with weight of 150 lbs.  Patient notes ultimate desired goal is possibly between 135-140 lbs. Encouraged to increase working out to 4 times daily.  Can use Miralax to help with bowel movement consistency as this can also lead to undesired bloating and weight gain.  - Patient mentions her history of Vitamin D deficiency, wonders if she needs to resume her supplementation. Will repeat levels. Also with history of dyslipdemia, will also screen for thyroid, and diabetes. Patient was previously receiving Vitamin B12 injections, would recommend screening B12 levels first prior to resuming injections.  - Patient with elevated BP today. No prior history of HTN. May be secondary to medication. Will continue to monitor, if BPs continue to rise significantly, will need to discontinue medication.  - Will f/u in 1 month for weight management.  Patient is also due for an annual exam. Will schedule in the next 3 months.    A total of 15 minutes were spent face-to-face with the patient during this encounter and over half of that time dealt with counseling and coordination of care.   Hildred Laser, MD [facility]

## 2019-12-30 NOTE — Progress Notes (Signed)
Pt present for weight management. Pt stated that she restarted back on her phentermine about 2 weeks ago and needs a refill of the medication.

## 2020-01-02 ENCOUNTER — Encounter: Payer: Self-pay | Admitting: Obstetrics and Gynecology

## 2020-01-02 DIAGNOSIS — E785 Hyperlipidemia, unspecified: Secondary | ICD-10-CM | POA: Insufficient documentation

## 2020-01-02 DIAGNOSIS — E669 Obesity, unspecified: Secondary | ICD-10-CM

## 2020-01-02 HISTORY — DX: Hyperlipidemia, unspecified: E78.5

## 2020-01-02 HISTORY — DX: Obesity, unspecified: E66.9

## 2020-01-08 ENCOUNTER — Other Ambulatory Visit: Payer: 59

## 2020-01-27 ENCOUNTER — Encounter: Payer: 59 | Admitting: Obstetrics and Gynecology

## 2020-02-04 ENCOUNTER — Encounter: Payer: 59 | Admitting: Obstetrics and Gynecology

## 2020-02-04 ENCOUNTER — Other Ambulatory Visit: Payer: 59

## 2020-03-09 ENCOUNTER — Other Ambulatory Visit: Payer: Self-pay

## 2020-03-09 MED ORDER — ESTROGENS CONJUGATED 1.25 MG PO TABS: 1.2500 mg | ORAL_TABLET | Freq: Every day | ORAL | 3 refills | Status: AC

## 2021-04-25 ENCOUNTER — Other Ambulatory Visit: Payer: Self-pay | Admitting: Obstetrics and Gynecology

## 2021-05-10 IMAGING — MR MRI OF THE RIGHT ELBOW WITHOUT AND WITH CONTRAST
6 of 9 series · 25 of 40 positions shown · IV contrast (multihance)
Comparison: Outside radiograph 04/29/2019

CLINICAL DATA: Elbow pain, question of elbow cyst

EXAM:
MRI OF THE RIGHT ELBOW WITHOUT AND WITH CONTRAST
TECHNIQUE: Multiplanar, multisequence MR imaging of the elbow was performed
before and after the administration of intravenous contrast.
CONTRAST:  15mL MULTIHANCE GADOBENATE DIMEGLUMINE 529 MG/ML IV SOLN

[Series 10: T1 · axial · 3.0mm · 0.23mm/px · z∈[-13,+94]mm · 4 of 28 slices shown (1 of 2)]
[im 1/28]
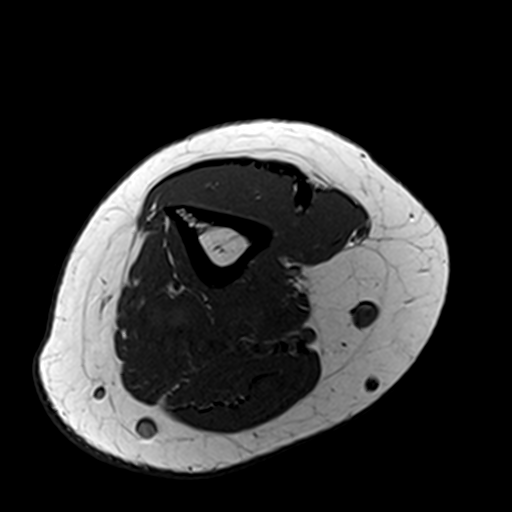
[im 10/28]
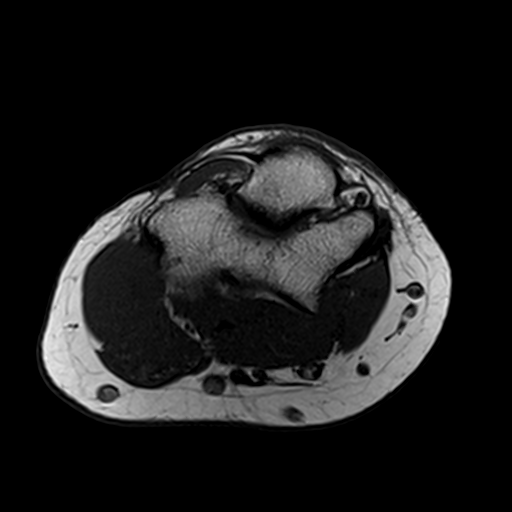
[im 19/28]
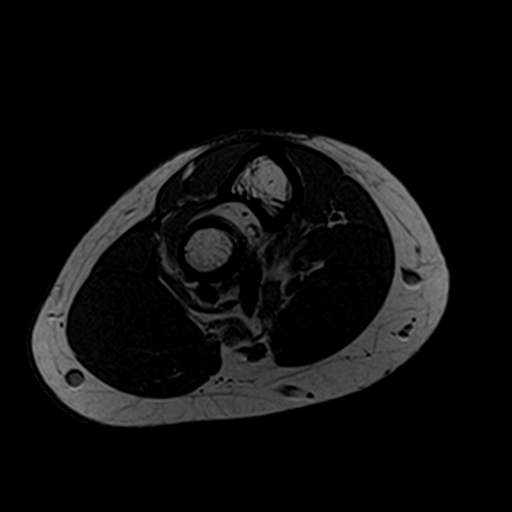
[im 28/28]
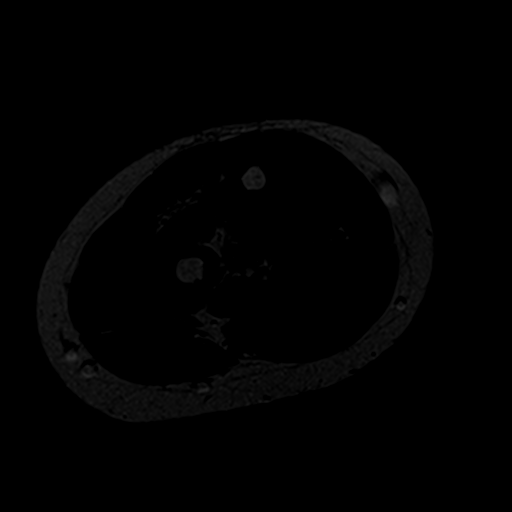

[Series 11: T2 fat-sat · axial · 3.0mm · 0.23mm/px · z∈[-13,+94]mm · 4 of 28 slices shown (1 of 2)]
[im 1/28]
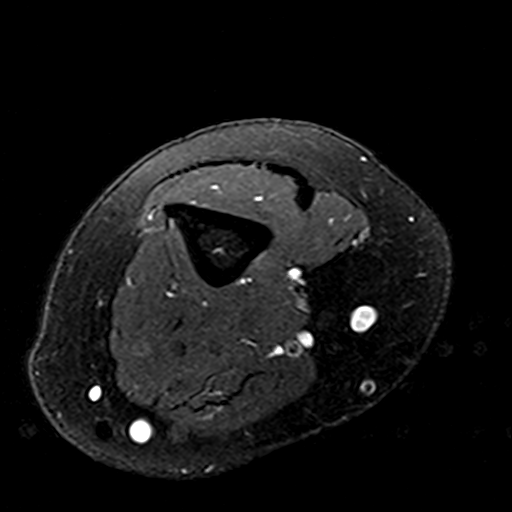
[im 10/28]
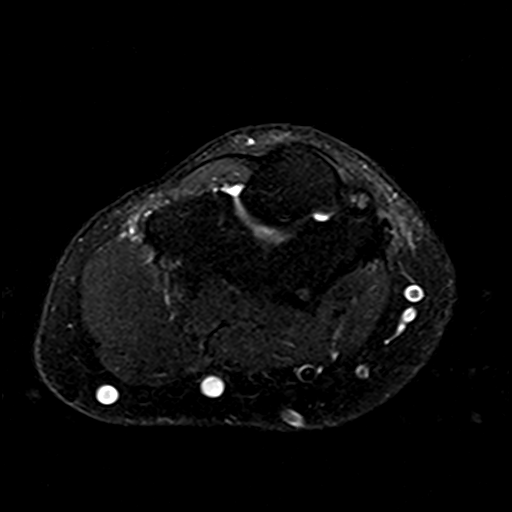
[im 19/28]
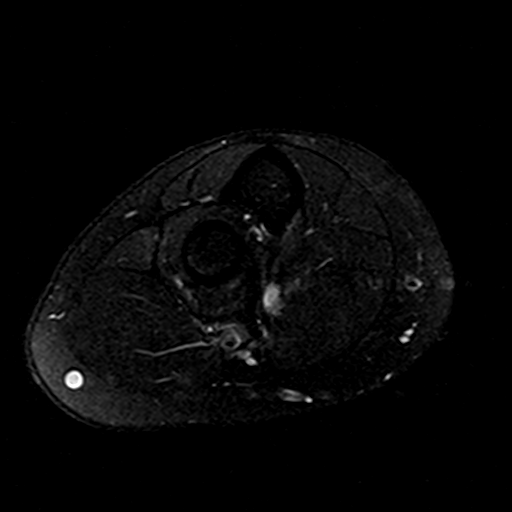
[im 28/28]
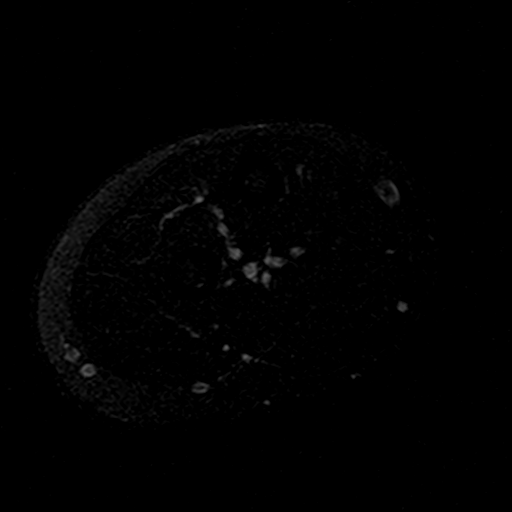

[Series 12: T2 fat-sat · coronal · 3.0mm · 0.27mm/px · 4 of 20 slices shown (2 of 2)]
[im 1/20]
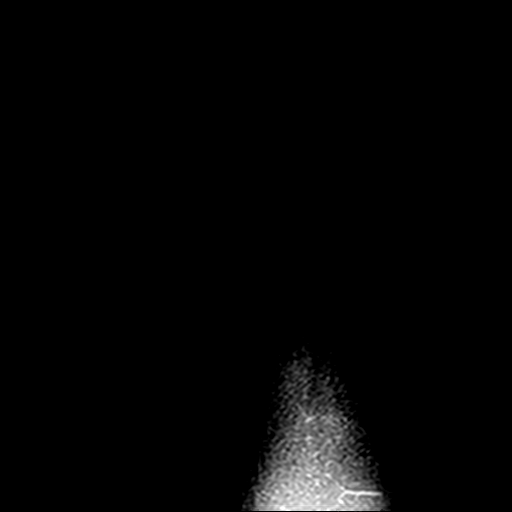
[im 7/20]
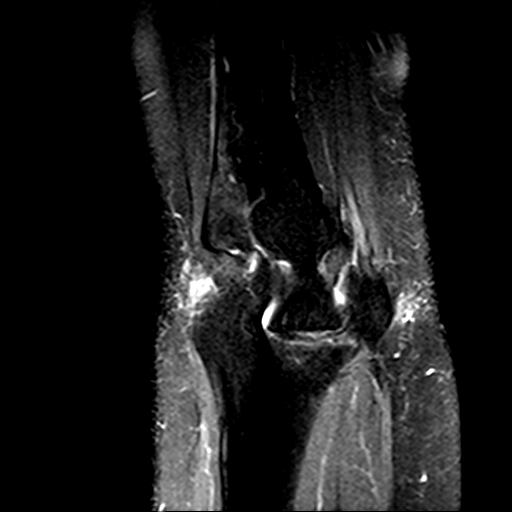
[im 13/20]
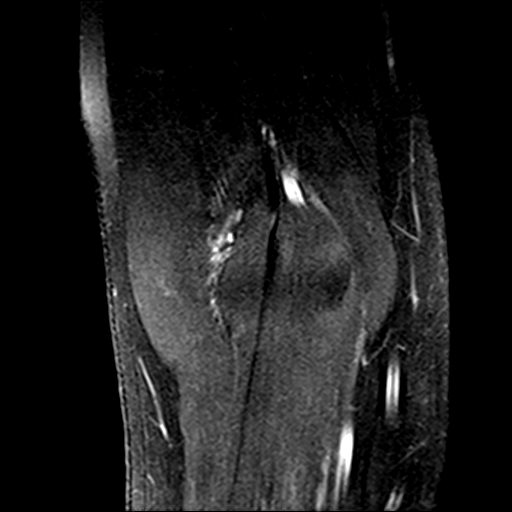
[im 20/20]
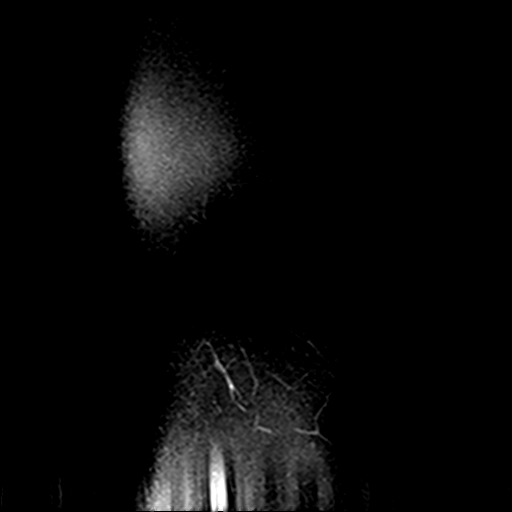

[Series 13: T1 · coronal · 3.0mm · 0.55mm/px · 4 of 20 slices shown (2 of 2)]
[im 1/20]
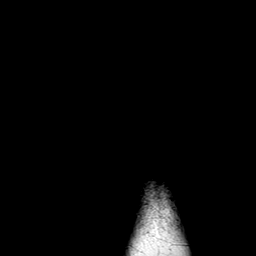
[im 7/20]
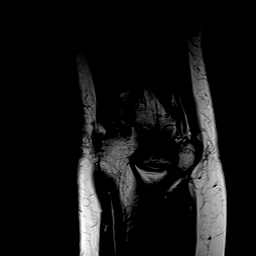
[im 13/20]
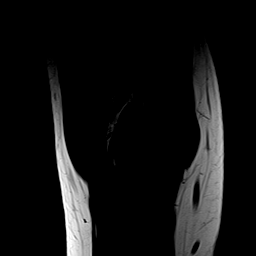
[im 20/20]
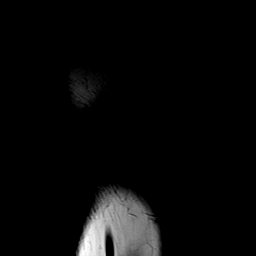

[Series 14: PD fat-sat · sagittal · 3.0mm · 0.55mm/px · 5 of 26 slices shown]
[im 1/26]
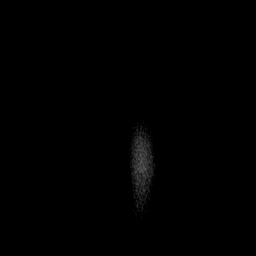
[im 7/26]
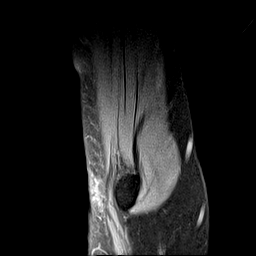
[im 13/26]
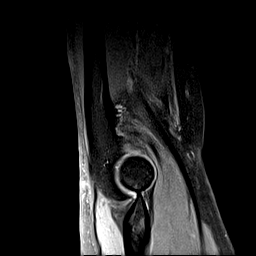
[im 19/26]
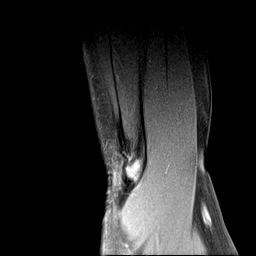
[im 26/26]
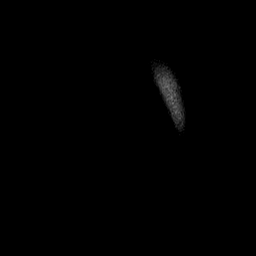

[Series 15: t1_tse_ax fs · axial · 3.0mm · 0.23mm/px · z∈[-13,+66]mm · 4 of 28 slices shown]
[im 1/28]
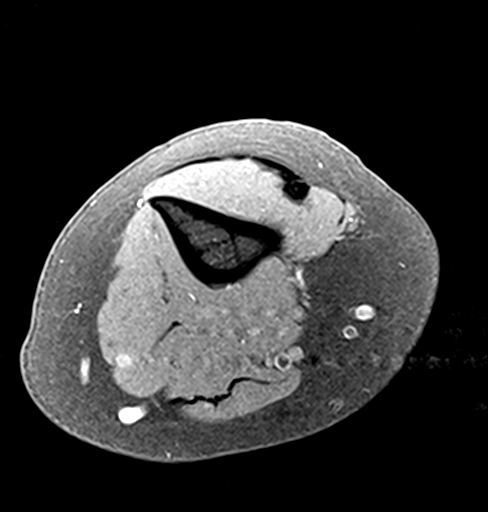
[im 7/28]
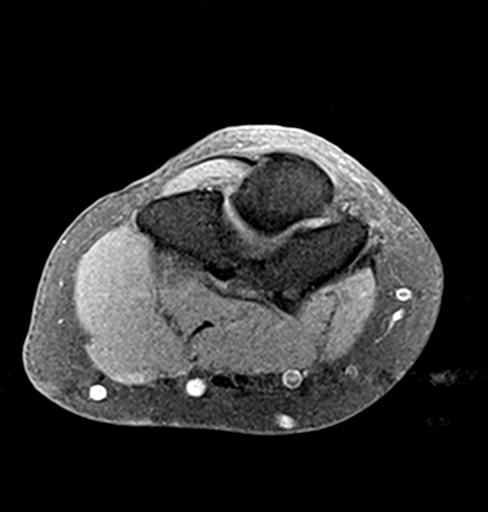
[im 14/28]
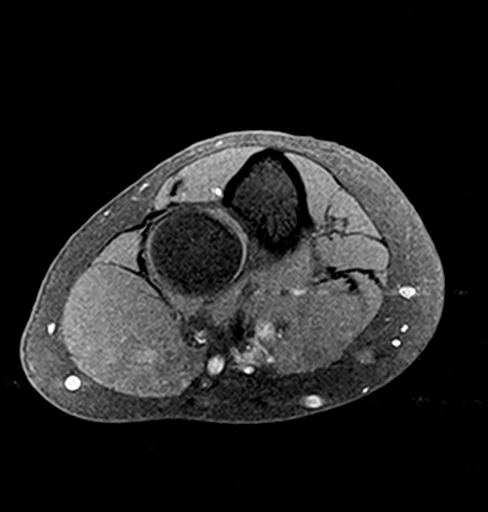
[im 21/28]
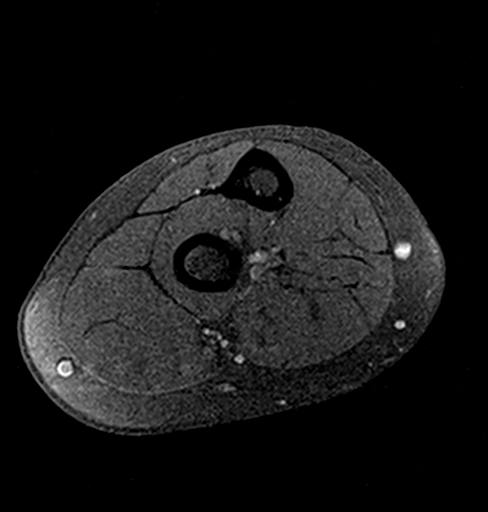

[25 of 40 positions shown; findings below may reference images not displayed]

FINDINGS: TENDONS

Common forearm flexor origin: Intact with normal signal.

Common forearm extensor origin: There is a partial tear at the
insertion site of the common extensor tendon measuring 4 mm in
length. There are still intact fibers throughout. There is
surrounding soft tissue edema.

Biceps: Intact.

Triceps: Intact with normal signal.

LIGAMENTS

Medial stabilizers: The ulnar collateral ligament is intact.

Lateral stabilizers: The lateral ulnar and radial collateral
ligaments appear intact.

Cartilage: Preserved.  No focal chondral defect demonstrated.

Joint: No joint effusion or loose body observed.

Cubital tunnel: Unremarkable.  The ulnar nerve appears normal.

Bones: No fracture, marrow edema, or pathologic marrow abnormality.

Other: No enhancing soft tissue mass is seen. No focal cyst. No
subcutaneous soft tissue edema noted.
IMPRESSION: High-grade partial tear of the insertion of the common extensor
tendon with fluid signal and surrounding soft tissue edema.

No enhancing soft tissue mass or cyst is seen.

## 2021-05-24 ENCOUNTER — Encounter: Payer: 59 | Admitting: Certified Nurse Midwife

## 2021-05-30 ENCOUNTER — Encounter: Payer: 59 | Admitting: Certified Nurse Midwife

## 2021-10-02 ENCOUNTER — Other Ambulatory Visit: Payer: Self-pay | Admitting: Family

## 2021-10-02 DIAGNOSIS — N951 Menopausal and female climacteric states: Secondary | ICD-10-CM

## 2021-10-02 NOTE — Telephone Encounter (Signed)
Has not seen providers in this practice. Decline request for refill
